# Patient Record
Sex: Female | Born: 1960 | Race: White | Hispanic: No | Marital: Married
Health system: Southern US, Community
[De-identification: ages and names within clinical notes are randomized; demographics above are authoritative.]

---

## 2005-06-26 ENCOUNTER — Ambulatory Visit: Payer: Self-pay | Admitting: Unknown Physician Specialty

## 2006-07-02 ENCOUNTER — Ambulatory Visit: Payer: Self-pay | Admitting: Unknown Physician Specialty

## 2010-07-11 ENCOUNTER — Ambulatory Visit: Payer: Self-pay | Admitting: Obstetrics and Gynecology

## 2012-03-07 ENCOUNTER — Ambulatory Visit: Payer: Self-pay | Admitting: Obstetrics and Gynecology

## 2013-08-23 ENCOUNTER — Observation Stay: Payer: Self-pay | Admitting: Internal Medicine

## 2013-08-23 LAB — COMPREHENSIVE METABOLIC PANEL
Albumin: 3.3 g/dL — ABNORMAL LOW (ref 3.4–5.0)
Anion Gap: 11 (ref 7–16)
Bilirubin,Total: 0.4 mg/dL (ref 0.2–1.0)
Calcium, Total: 8.4 mg/dL — ABNORMAL LOW (ref 8.5–10.1)
Chloride: 107 mmol/L (ref 98–107)
Co2: 21 mmol/L (ref 21–32)
EGFR (African American): >60
EGFR (Non-African Amer.): >60
Glucose: 156 mg/dL — ABNORMAL HIGH (ref 65–99)
Osmolality: 283 (ref 275–301)
Potassium: 3 mmol/L — ABNORMAL LOW (ref 3.5–5.1)
SGOT(AST): 31 U/L (ref 15–37)
Sodium: 139 mmol/L (ref 136–145)

## 2013-08-23 LAB — CBC
HGB: 13.3 g/dL (ref 12.0–16.0)
MCH: 33.5 pg (ref 26.0–34.0)
MCHC: 34.6 g/dL (ref 32.0–36.0)
MCV: 97 fL (ref 80–100)
Platelet: 230 10*3/uL (ref 150–440)
RBC: 3.97 10*6/uL (ref 3.80–5.20)
RDW: 11.7 % (ref 11.5–14.5)
WBC: 7.8 10*3/uL (ref 3.6–11.0)

## 2013-08-23 LAB — URINALYSIS, COMPLETE
Bilirubin,UR: NEGATIVE
Blood: NEGATIVE
Glucose,UR: NEGATIVE mg/dL (ref 0–75)
Hyaline Cast: 3
Leukocyte Esterase: NEGATIVE
Nitrite: NEGATIVE
Protein: 100
RBC,UR: NONE SEEN /HPF (ref 0–5)
Specific Gravity: 1.019 (ref 1.003–1.030)

## 2013-08-23 LAB — TSH: Thyroid Stimulating Horm: 4.38 u[IU]/mL

## 2013-08-24 LAB — BASIC METABOLIC PANEL
Anion Gap: 6 — ABNORMAL LOW (ref 7–16)
BUN: 13 mg/dL (ref 7–18)
Chloride: 108 mmol/L — ABNORMAL HIGH (ref 98–107)
Co2: 25 mmol/L (ref 21–32)
Creatinine: 0.71 mg/dL (ref 0.60–1.30)
EGFR (African American): >60
EGFR (Non-African Amer.): >60
Sodium: 139 mmol/L (ref 136–145)

## 2013-08-24 LAB — LIPID PANEL
Cholesterol: 149 mg/dL (ref 0–200)
VLDL Cholesterol, Calc: 34 mg/dL (ref 5–40)

## 2013-08-24 LAB — HEMOGLOBIN A1C: Hemoglobin A1C: 5 % (ref 4.2–6.3)

## 2013-08-24 LAB — CBC WITH DIFFERENTIAL/PLATELET
Basophil #: 0 10*3/uL (ref 0.0–0.1)
Basophil %: 0.4 %
Eosinophil #: 0.1 10*3/uL (ref 0.0–0.7)
HCT: 36.2 % (ref 35.0–47.0)
Lymphocyte %: 19 %
MCH: 33.6 pg (ref 26.0–34.0)
MCHC: 35.3 g/dL (ref 32.0–36.0)
MCV: 95 fL (ref 80–100)
Neutrophil #: 8.6 10*3/uL — ABNORMAL HIGH (ref 1.4–6.5)
Neutrophil %: 73.4 %
Platelet: 217 10*3/uL (ref 150–440)
RDW: 11.7 % (ref 11.5–14.5)
WBC: 11.7 10*3/uL — ABNORMAL HIGH (ref 3.6–11.0)

## 2013-08-24 LAB — MAGNESIUM: Magnesium: 1.4 mg/dL — ABNORMAL LOW

## 2013-08-24 LAB — TROPONIN I: Troponin-I: <0.02 ng/mL

## 2015-03-22 NOTE — Consult Note (Signed)
PATIENT NAME:  Heidi [name], Heidi [name] MR#:  [id number] DATE OF BIRTH:  Feb 25, 1961  DATE OF CONSULTATION:  08/25/2013  REFERRING PHYSICIAN:  Katharina Caperima Vaickute, MD CONSULTING PHYSICIAN:  Lamar BlinksBruce J. Shloma Roggenkamp, MD  REASON FOR CONSULTATION: Syncope with cardiomyopathy and left bundle branch block.   CHIEF COMPLAINT: "I passed out."   HISTORY OF PRESENT ILLNESS: This is a 54 year old female with no history of cardiovascular disease who had a syncopal episode. The patient has had some dizziness, weakness and shortness of breath and passed out and hurt herself. At that time, the patient woke up and had improvements with no evidence of further issues. During her hospitalization, she did have and telemetry with no evidence of rhythm disturbances and/or no evidence of heart attack with a normal troponin and EKG. EKG shows normal sinus rhythm with left bundle branch block unchanged throughout her hospital course. Echocardiogram has shown severe LV systolic dysfunction with moderate left ventricular enlargement with mild mitral and tricuspid regurgitation. The patient has had no further evidence of syncope, chest pain, shortness of breath or other symptoms in the last many weeks.   REVIEW OF SYSTEMS: The remainder is negative for vision change, ringing in the ears, hearing loss, cough, congestion, heartburn, nausea, vomiting, diarrhea, bloody stools, stomach pain, extremity pain, leg weakness, cramping of the buttocks, known blood clots, headaches, blackouts, dizzy spells, nosebleeds, congestion, trouble swallowing, frequent urination, urination at night, muscle weakness, numbness and anxiety in the past.   PAST MEDICAL HISTORY: Hypertension.   FAMILY HISTORY: No family members with early onset of cardiovascular disease or hypertension.   SOCIAL HISTORY: Currently denies tobacco use. Occasionally drinks alcohol.   ALLERGIES: As listed.   MEDICATIONS: As listed.   PHYSICAL EXAMINATION: VITAL SIGNS: Blood pressure is  133/68 bilaterally and heart rate 72 upright and reclining and regular.  GENERAL: She is a well appearing female in no acute distress.  HEENT: No icterus, thyromegaly, ulcers, hemorrhage or xanthelasma.  HEART: Regular rate and rhythm. Normal S1 and S2 without murmur, gallop or rub. PMI is normal size and placement. Carotid upstroke normal without bruit. Jugular venous pressure is normal.  LUNGS: Have normal respirations, clear to auscultation.  ABDOMEN: Soft and nontender without hepatosplenomegaly or masses. Abdominal aorta is normal size without bruit.  EXTREMITIES: Shows 2+ bilateral pulses in dorsal, pedal, radial and femoral arteries without lower extremity edema, cyanosis, clubbing or ulcers.  NEUROLOGIC: She is oriented to time, place and person with normal mood and affect.   ASSESSMENT: A 54 year old female with hypertension and new onset syncope of unknown etiology, left bundle branch block, cardiomyopathy with no current evidence of congestive heart failure.   RECOMMENDATIONS: 1.  Carvedilol 3.125 mg twice a day for cardiomyopathy and reduction of possible rhythm disturbances causing syncope. 2.  The patient is okay for discharge to home, was ambulating well without any further significant symptoms. 3.  Holter monitor for 48 hours after discharge for further evaluation of rhythm disturbances that may have caused significant issues listed above.  4.  Further outpatient diagnostic testing including stress test and/or cardiac catheterization as necessary depending on above. ____________________________ Lamar BlinksBruce J. Jaclyn Andy, MD bjk:sb D: 08/25/2013 12:55:33 ET [name]: 08/25/2013 13:25:10 ET JOB#: [id number]  cc: Lamar BlinksBruce J. Svara Twyman, MD, <Dictator> Lamar BlinksBRUCE J Dario Yono MD ELECTRONICALLY SIGNED 08/29/2013 10:34

## 2015-03-22 NOTE — Consult Note (Signed)
PATIENT NAME:  Heidi [name], Heidi [name] MR#:  [id number] DATE OF BIRTH:  [birth year]  DATE OF CONSULTATION:  08/24/2013  REFERRING PHYSICIAN:  Felipa Furnaceoberto Sanchez Gutierrez, MD CONSULTING PHYSICIAN:  Daja Shuping K. Sherryll BurgerShah, MD PRIMARY CARE PHYSICIAN, GYNECOLOGIST: Beryle QuantLynde G. Knowles-Jonas, MD  REASON FOR CONSULTATION: Syncopal episode versus seizure.   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   The patient has been going through quite stressful situation because she does not have enough information and multiple physicians are coming to ask her questions.   The patient mentioned that she has been having tremors in both her hands for last 2 weeks or so, which is progressively getting worse. She was also noted to have some high blood pressure, was seen by her GYN doctor and was started on hydrochlorothiazide.   She stopped taking it because she was shaking a lot.   The patient was shaking so much that she was not able to cook her dinner, et Karie Sodacetera.   She was standing at the checking counter in the [employer] and felt something coming on and fell. She was quite pale. The clerk noted that the patient was staring and then shaking all over. She had bitten her tongue. The patient had not lost control of her bowel and bladder.   The patient was brought to the ER. At that time, she was back to herself. The patient was also found to have a left bundle branch block.   PAST MEDICAL HISTORY: Significant for recently diagnosed hypertension.   PAST SURGICAL HISTORY: None.   FAMILY HISTORY: Significant for stroke in her father, as well as lung cancer in the grandfather.   SOCIAL HISTORY: Significant in that she does not smoke. She drinks wine occasionally. She does not do any drugs. 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 She lives with her husband.    CURRENT MEDICATIONS: I reviewed her home medication list.   REVIEW OF SYSTEMS:  Positive for tremors and recent syncope. Otherwise, 10-system review of system was asked and was found to be negative.   LABORATORY DATA:  I reviewed her current labs.   PHYSICAL EXAMINATION: VITAL SIGNS: Temperature 97.5, pulse 80, respiratory rate 18, blood pressure 154/89, pulse oximetry 98.  GENERAL: She is a middle-aged Caucasian female standing next to the washbasin with her husband and a family friend. The patient looked quite anxious and nervous and was shaking when she was trying to show me her medications.  LUNGS: Clear to auscultation.  HEART: S1, S2 heart sounds. Carotid exam did not reveal any bruit.  EYES: Her funduscopic exam was unremarkable.  MENTAL STATUS EXAMINATION: She was alert, oriented, followed 2-step commands. Her attention, concentration, memory, fund of knowledge seem to be appropriate. Her language is intact. She does not have neurological neglect. I did not see any other paranoia, delusions, hallucinations. She does seem to be quite anxious.  CRANIAL NERVES: Her pupils are equal, round and reactive. Extraocular movements were intact. Her visual fields were full. Her face was symmetric. Tongue was midline. Facial sensations and hearing were intact. Her palate was going symmetrically bilaterally. Her tongue protruded straight. Her shoulder shrug and neck strength seems to be 5 out of 5.  MOTOR EXAM: She has normal tone and strength of 5 out of 5, but she has generalized tremor which changes in frequency and  severity.  SENSATIONS: Intact to light touch.  DEEP TENDON REFLEXES: Were 2+, except her bilateral ankle jerks were 1+. Her toes were downgoing.  GAIT: Normal.  COORDINATION: Okay.  The patient also had tongue laceration on both sides, left more than right.   RADIOLOGICAL DATA: I reviewed her MRI of the brain which was unremarkable, as well as the EEG and carotid ultrasound which  were unremarkable, except minimal plaque without any hemodynamic significance.   ASSESSMENT AND PLAN: 1.  The patient seemed to have a syncopal seizure, which is a seizure being the second event secondary to syncope.   The patient has been going through significant stress for the last couple of weeks with [facility] being transitioned to [employer], a new electronic medical record system, with the patient being a lead for [employer].   The patient was having significant tremors. The patient has a known history of vasovagal syncope when she gets blood drawn.   I believe the episode that happened at [facility] where she was standing for some time might have represented vasovagal syncope, where some patients can have syncopal myoclonus, but the patient seemed to have generalized seizure from it.   I think this should be considered as a provoked seizure, I will not start her on antiepileptic medications. I agree with seizure work-up of MRI and EEG, which have been unremarkable.   The patient still should follow the seizure precautions such as no driving for 6 months, not climbing on high ladder or swimming by herself, Melvern Banker.   I taught seizure first aid to the patient and family such as not putting anything in the mouth, calling 911, turning the patient on the side, et Karie Soda.   2.  Stress and anxiety might be the cause of her tremors, which does not seem to be similar to benign essential tremor.   The patient can be seen as an outpatient by psychiatrist for her anxiety management, which is quite situational.   I will see her back as an outpatient in a week or 2.  ____________________________ Jabree Pernice K. Sherryll Burger, MD hks:jm D: 08/24/2013 19:41:57 ET [name]: 08/24/2013 21:21:57 ET JOB#: [id number]  cc: Charli Liberatore K. Sherryll Burger, MD, <Dictator> Beryle Quant. Toya Smothers, MD Durene Cal [facility] MD ELECTRONICALLY SIGNED 08/25/2013 8:40

## 2015-03-22 NOTE — H&P (Signed)
PATIENT NAME:  Heidi [name], Heidi [name] MR#:  [id number] DATE OF BIRTH:  1961-10-18  DATE OF ADMISSION:  08/23/2013  PRIMARY CARE PHYSICIAN: Dr. Skeet Simmer, GYN.  CHIEF COMPLAINT: Syncopal episode.   HISTORY OF PRESENT ILLNESS: This is a very nice 54 year old female with a history of recently diagnosed hypertension. She has been healthy all her life. Her only PCP has been actually her GYN who she sees once a year for her Pap smears. The patient starts with 3-1/2 weeks ago with elevation of high blood pressure, for what she was started on hydrochlorothiazide. She took hydrochlorothiazide for 2 weeks and then she had to stop it because she was shaking a lot. The shakiness get worse and worse and worse to the point that within the last couple of nights, she was not able even to cook dinner because her right hand was not able to be operated. The patient actually had a normal day today. She got up, went to work being her normal self. After she finished her work, she went to the store. She walked around and then her eyes started crossing. She felt faint and lightheaded, felt a little bit nauseous, and then she blacked out. Apparently, she was still standing at some point while blacked out, sustained by the cart, and then she fell to the floor. She does not remember anything else, but apparently the clerk in the store said that she was lying down on the floor, that there might have been some shaking, she bit her tongue up. There was no witnessed seizure activity other than what was described by the clerk. The patient was brought into the Emergency Department. There was a normal physical exam, normal CT of the head. The patient is admitted for evaluation due to left bundle branch block that is unknown if it is new or old. The patient is in good condition, and the only problem that she is having is that she is talking a lot and she is not able to find the words. The patient is admitted for observation.   REVIEW OF SYSTEMS:     CONSTITUTIONAL: No fever, fatigue, weakness, weight loss or weight gain.  EYES: No double vision, blurry vision.  EARS, NOSE, THROAT: No difficulty swallowing. No tinnitus.  RESPIRATORY: No cough, wheezing or painful respirations.  CARDIOVASCULAR: No chest pain, orthopnea, paroxysmal nocturnal dyspnea. The patient had an episode of syncope many years ago after giving blood, but never repeated again up until now.  GASTROINTESTINAL: No nausea, vomiting, abdominal pain, constipation, diarrhea.  GENITOURINARY: No dysuria, hematuria, changes in frequency.  ENDOCRINE: No polyuria, polydipsia, polyphagia, cold or heat intolerance.  HEMATOLOGIC AND LYMPHATIC: No anemia, easy bruising or bleeding.  SKIN: No rashes or petechiae or new lesions.  MUSCULOSKELETAL: No significant neck pain, back pain.  NEUROLOGIC: No numbness or tingling. No vertigo. No CVAs or TIAs.  PSYCHIATRIC: No insomnia, depression or significant agitation.   PAST MEDICAL HISTORY: Recently diagnosed hypertension. Otherwise, the patient has been healthy all her life.   ALLERGIES: No known drug allergies.   PAST SURGICAL HISTORY: None.   FAMILY HISTORY: CVA in her father. No MI. Positive lung cancer in her grandfather.   MEDICATIONS: Hydrochlorothiazide, recently started. The patient takes multivitamins and birth control.   SOCIAL HISTORY: Does not smoke. She drinks 2 glasses of wine every night. She does not use any drugs. 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 She is very active.   PHYSICAL EXAMINATION: VITAL SIGNS: Blood  pressure is 163/77, pulse 93, respirations 18, temperature 98.2, pulse oximetry of 100% on room air.  GENERAL: The patient is alert, oriented x 3, in no acute distress, no respiratory distress. Hemodynamically stable. Her speech is rapid and occasionally she is not able to find words. There is no facial droop or any other significant neurologic changes. Her speech is goal  directed.  HEENT: Pupils are equal and reactive. Extraocular movements are intact. Mucosae are moist. Anicteric sclerae. Pink conjunctivae. No oral lesions. No oropharyngeal exudates. Her tongue is central. Her uvula is central. There is no facial droop. Positive oral lesions on the tongue, some erythema from patient biting her tongue, no active bleeding.  NECK: Supple. No JVD. No thyromegaly. No adenopathy. No carotid bruits. No rigidity.  CARDIOVASCULAR: Regular rate and rhythm. No murmurs, rubs or gallops are appreciated. Slightly tachycardic in the 90s. No displacement of PMI.  LUNGS: Clear without any wheezing or crepitus. No use of accessory muscles. No significant dullness to percussion.  ABDOMEN: Soft, nontender, nondistended. No hepatosplenomegaly. No masses. Bowel sounds are positive. No rebound.  EXTREMITIES: No edema, cyanosis or clubbing.  VASCULAR: Pulses +2. Capillary refill less than 3.  MUSCULOSKELETAL: No joint effusions or joint swelling. Normal range of motion all joints.  SKIN: No rashes, petechiae. Normal turgor.  NEUROLOGIC: Cranial nerves II through XII intact. Strength is 5/5 in all 4 extremities. Deep tendon reflexes +2.  PSYCHIATRIC: No significant insomnia. Mild anxiety and rapid speech. No significant slurred speech.  LYMPHATICS: Negative for lymphadenopathy in neck or supraclavicular areas.   LABORATORY AND DIAGNOSTIC DATA: Glucose 156, BUN 19, creatinine 0.92; potassium is 3.0; calcium is 8.4, total protein 6.3, albumin 3.3. LFTs otherwise within normal limits. Troponin is negative x 1. White count 7.8, hemoglobin 13.3 and platelet count 230.   EKG: Left bundle branch but no ST depression or elevation. It is unknown if this left bundle branch has been there before or not.  ASSESSMENT AND PLAN: A 54 year old female with a history of recently diagnosed hypertension, comes with syncopal episode.  1.  Syncope: The patient had an episode of syncope that was not witnessed.  The patient was found on the floor drooling. There is no good history about tonic-clonic movements, but apparently the clerk in the store found her shaking. The patient has been complaining of tremors for the past 2 weeks that are new, and she relates that to the new medications that she is taking.  Work-up will include an echocardiogram and an ultrasound of the carotid arteries. The echocardiogram will focus on finding of left bundle branch block. We are going to put her on telemetry. The patient is going to be monitored on her vital signs. In general, blood pressure is elevated. We are going to correct blood pressures only if they are above 160. We are going to use clonidine for that.  2.  As far as her shaking and changes in mental status with speech problems, she says that she was slurred at some point. At this moment she is not slurred, but she is talking really fast and her speech sometimes cuts off and she is not able to find words. This could be secondary to stress and anxiety, but also since the patient has tremors and had a passing out episode, we are going to get an MRI to rule out any metastatic disease, any new-onset cerebrovascular accidents. The patient also is going to have a TSH to evaluate for hyperthyroidism. It looks like it is negative.  Thyroid is not palpable but again, her speech is rapid and she is slightly tachycardic.  3.  As far as her hypertension, the patient is going to be taking clonidine p.r.n. No long-acting medications are going to be put on the patient, as we do not want to lower her blood pressure too fast or too low. Consider adding on another agent, maybe Norvasc, for discharge. The patient will follow up with her primary care physician.  4.  We are going to replace her potassium, as it is low. We are going to check a hemoglobin A1c, as her blood sugars are in the 150s.  6.  As for deep vein thrombosis prophylaxis, we are going to give her heparin 3 times a day. 7.  As  gastrointestinal prophylaxis, we are going to give her Protonix.  8.  For prophylaxis of cerebrovascular accident, even if it is very low probability, we are going to do aspirin.   TIME SPENT: I spent about 55 minutes with this patient.    ____________________________ Felipa Furnace, MD rsg:jm D: 08/23/2013 21:25:21 ET T: 08/23/2013 21:52:16 ET JOB#: [id number]  cc: Felipa Furnace, MD, <Dictator> Heidi [name] Juanda Chance MD ELECTRONICALLY SIGNED 08/24/2013 11:11

## 2015-03-22 NOTE — Discharge Summary (Signed)
PATIENT NAME:  Heidi [name], [name] MR#:  [id number] DATE OF BIRTH:  [birth year]  DATE OF ADMISSION:  08/23/2013 DATE OF DISCHARGE:  08/25/2013  ADMITTING DIAGNOSIS: Syncope.   DISCHARGE DIAGNOSES: 1.  Complicated syncope.  2.  Cardiomyopathy with ejection fraction of less than 20%, moderate to severe mitral regurgitation, moderate tricuspid regurgitation.  3.  Hyperglycemia with Hemoglobin A1c 5. 4.  Hypokalemia, hypomagnesemia, hypocalcemia, relative vitamin D deficiency.  5.  Abnormal EKG with left bundle branch block. 6.  History of hypertension.   DISCHARGE CONDITION: Stable.   DISCHARGE MEDICATIONS: The patient is to continue: 1.  Vitamin C 1 tablet once daily. 2.  Calcium carbonate 1 tablet once daily. 3.  Glucosamine 1 tablet once daily. 4.  Junel 1/20 one tablet once daily. 5.  Cholecalciferol 1000 units once daily. 6.  Coreg 3.125 mg twice daily. 7.  Lisinopril 2.5 mg p.o. daily at bedtime. 8.  Aspirin 81 mg p.o. daily.  9.  Magnesium oxide 400 mg p.o. twice daily.   HOME OXYGEN: None.   DIET: 2 grams salt, low fat, low cholesterol, regular consistency.   ACTIVITY LIMITATIONS: As tolerated.  FOLLOWUP APPOINTMENT: With Dr. Gwen PoundsKowalski in 2 days after discharge and Dr. Sherryll BurgerShah, neurology, in 1 to 2 weeks after discharge.   CONSULTANTS: Dr. Sherryll BurgerShah of neurology and Dr. Gwen PoundsKowalski of cardiology.   RADIOLOGIC STUDIES: CT scan of head without contrast, on 24th of September 2014, revealed no evidence of acute intracranial abnormalities.   MRI of brain without contrast, on 25th of September 2014, revealed no focal abnormality, specifically no evidence of acute ischemia or focal abnormality.   Ultrasound of bilateral carotid arteries, on 25th of September 2014, revealed minimal internal carotid plaque formation on the right that is smooth (Dictation Anomaly) and without stenosis. Left carotid system showed some smooth plaque in the bulb which again caused no stenosis. Color Doppler and  spectral Doppler appearance was normal. Peak systolic velocities were normal bilaterally. Antegrade flow was seen in vertebral arteries without evidence of flow reversal. Internal to common carotid peak systolic velocity ratios were 2.131.16 on the right and 1.094 on the left.   Echocardiogram on the 25th of September 2014 showed left ventricular ejection fraction by visual estimation less than 20%, severely decreased global left ventricular systolic function, moderately increased left ventricular internal cavity size, mildly dilated left atrium as well as right atrium, moderate to severe mitral valve regurgitation, moderate tricuspid regurgitation, moderately increased left ventricular posterior wall thickness.   HISTORY OF PRESENT ILLNESS AND HOSPITAL COURSE: The patient is a 54 year old Caucasian female with past medical history significant for history of hypertension who presented to the hospital with complaints of syncopal episode. Please refer to Dr. Ardine EngSanchez's admission note on the 24th of September 2014.   On arrival to the Emergency Room, the patient's blood pressure was 163/77, pulse was 93, respiration rate was 18, temperature was 98.2 and pulse oximetry was 100% on room air. Physical examination was unremarkable.   The patient's lab data done in the Emergency Room was remarkable for potassium level of 3.0, BUN of 19 and glucose 156; otherwise, BMP was unremarkable. The patient's calcium level was low at 8.4. Magnesium 1.4. Liver enzymes revealed albumin level of 3.3, otherwise normal. Troponin was normal x 3. TSH was normal at 4.38. CBC was within normal limits. Urinalysis was unremarkable. EKG sinus rhythm at 93 beats per minute, fusion complexes and left bundle branch block was noted.  The patient was admitted to the hospital for  further evaluation. Because of her complicated syncope, a full workup was performed including carotid ultrasound and echocardiogram as well as MRI of the brain since the  patient had a feeling of left-sided numbness just prior to syncopizing. She also had an electroencephalogram done read by Dr. Malvin Johns. Electroencephalogram was performed and the 25th of September. The patient was noted to have routine EEG and in the awake as well as drowsy state and this was within normal limits. It was felt that the patient very likely had a syncopal episode of unclear etiology. However, arrhythmia could not have been excluded. Consultation with Dr. Arnoldo Hooker was obtained due to her echocardiogram showing ejection fraction of less than 20% and significant valve motion abnormalities. Dr. Gwen Pounds saw the patient in consultation on the 26th of September 2014. He felt the patient had unknown etiology for onset of syncope, left bundle branch block, as well as cardiomyopathy with no evidence of congestive heart failure. He recommended carvedilol as well as Holter monitor for 48 hours after discharge for further evaluation of rhythm disturbances and recommended to discharge the patient home. Further outpatient diagnostic testing including stress test and/or cardiac catheterization as necessary depending on prior studies, according to Dr. Philemon Kingdom recommendation. The patient is going to have her Holter monitor added for 48 hours. Her vital signs on the day of discharge are stable with temperature of 98.5, pulse 85, respiration rate 20, blood pressure 129/73 and saturation 97% on room air at rest. The patient is being discharged in stable condition with the above-mentioned medications and follow-up.   Of note, the patient's vitamin D level was checked and was found to be relatively low at 32.7. The patient's magnesium as well as potassium level were also found to be low and those were supplemented IV as well as p.o. Hemoglobin A1c was also checked and was found to be 5.0. Lipid panel was performed and LDL was 63, triglycerides were 168 and HDL was 152.   TIME SPENT: 40  minutes. ____________________________ Katharina Caper, MD rv:sb D: 08/25/2013 16:43:36 ET T: 08/25/2013 17:12:55 ET JOB#: [id number]  cc: Katharina Caper, MD, <Dictator> Lamar Blinks, MD Hemang K. Sherryll Burger, MD Katharina Caper MD ELECTRONICALLY SIGNED 09/12/2013 8:21

## 2016-02-06 ENCOUNTER — Other Ambulatory Visit: Payer: Self-pay | Admitting: Obstetrics and Gynecology

## 2016-02-06 DIAGNOSIS — Z1231 Encounter for screening mammogram for malignant neoplasm of breast: Secondary | ICD-10-CM

## 2019-05-19 ENCOUNTER — Other Ambulatory Visit: Payer: Self-pay | Admitting: Physician Assistant

## 2019-05-19 DIAGNOSIS — Z1231 Encounter for screening mammogram for malignant neoplasm of breast: Secondary | ICD-10-CM

## 2019-06-30 ENCOUNTER — Ambulatory Visit
Admission: RE | Admit: 2019-06-30 | Discharge: 2019-06-30 | Disposition: A | Discharge status: Home / Self Care | Payer: BC Managed Care – PPO | Source: Ambulatory Visit | Attending: Physician Assistant | Admitting: Physician Assistant

## 2019-06-30 DIAGNOSIS — Z1231 Encounter for screening mammogram for malignant neoplasm of breast: Secondary | ICD-10-CM | POA: Insufficient documentation

## 2019-08-24 ENCOUNTER — Other Ambulatory Visit: Admission: RE | Admit: 2019-08-24 | Payer: BC Managed Care – PPO | Source: Ambulatory Visit

## 2019-08-28 ENCOUNTER — Ambulatory Visit
Admission: RE | Admit: 2019-08-28 | Payer: BC Managed Care – PPO | Source: Home / Self Care | Admitting: Internal Medicine

## 2019-08-28 ENCOUNTER — Encounter: Admission: RE | Payer: Self-pay | Source: Home / Self Care

## 2019-08-28 SURGERY — COLONOSCOPY WITH PROPOFOL
Anesthesia: General

## 2020-04-06 IMAGING — MG DIGITAL SCREENING BILATERAL MAMMOGRAM WITH TOMO AND CAD
8 series · 8 of 24 positions shown · non-contrast
Comparison: Previous exam(s).

CLINICAL DATA: Screening.

EXAM:
DIGITAL SCREENING BILATERAL MAMMOGRAM WITH TOMO AND CAD

[L MLO synth-2D]
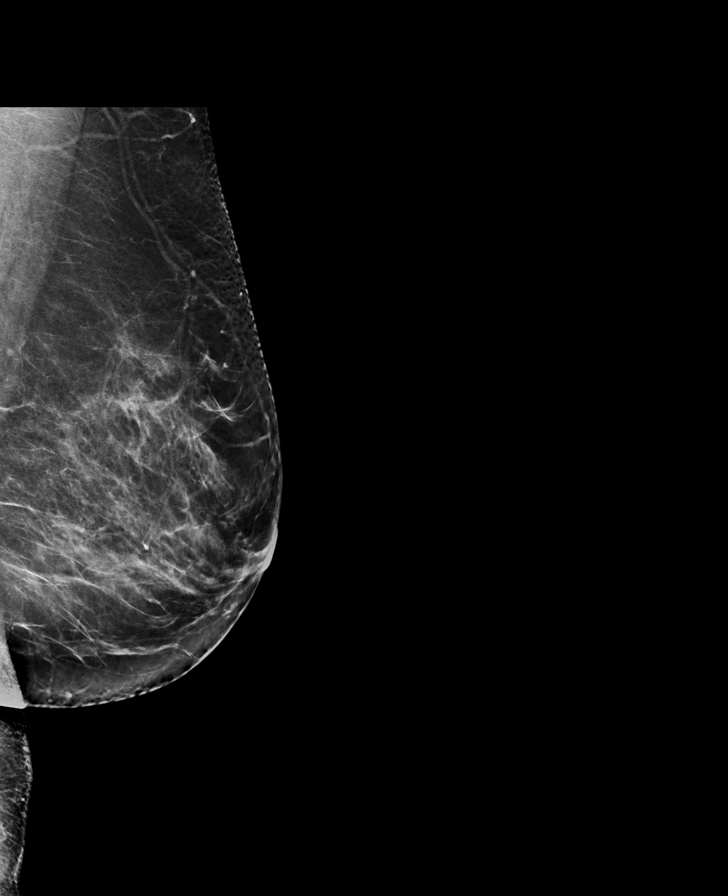

[R CC synth-2D]
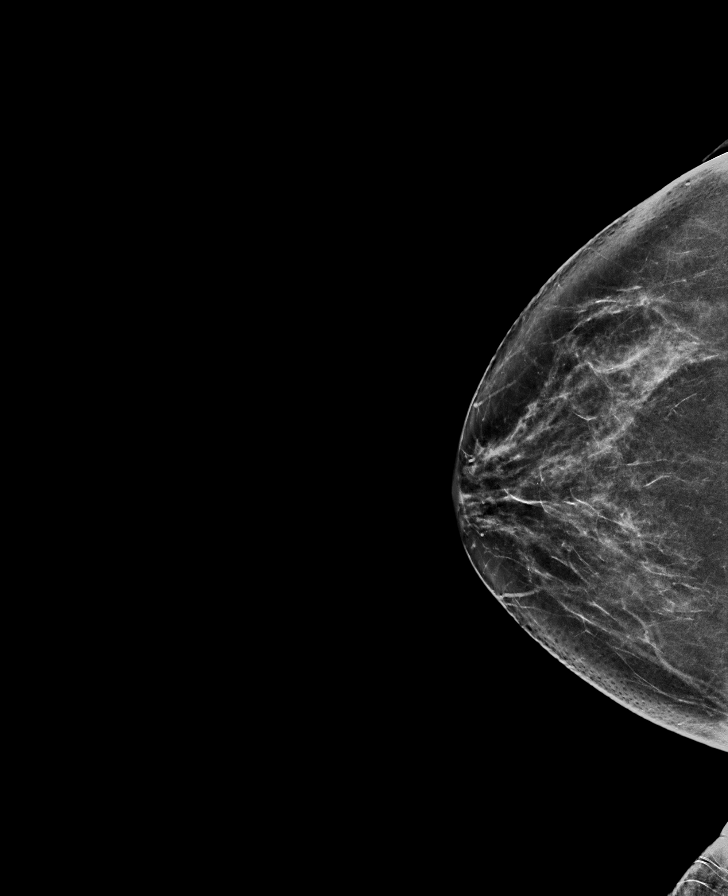

[L CC synth-2D]
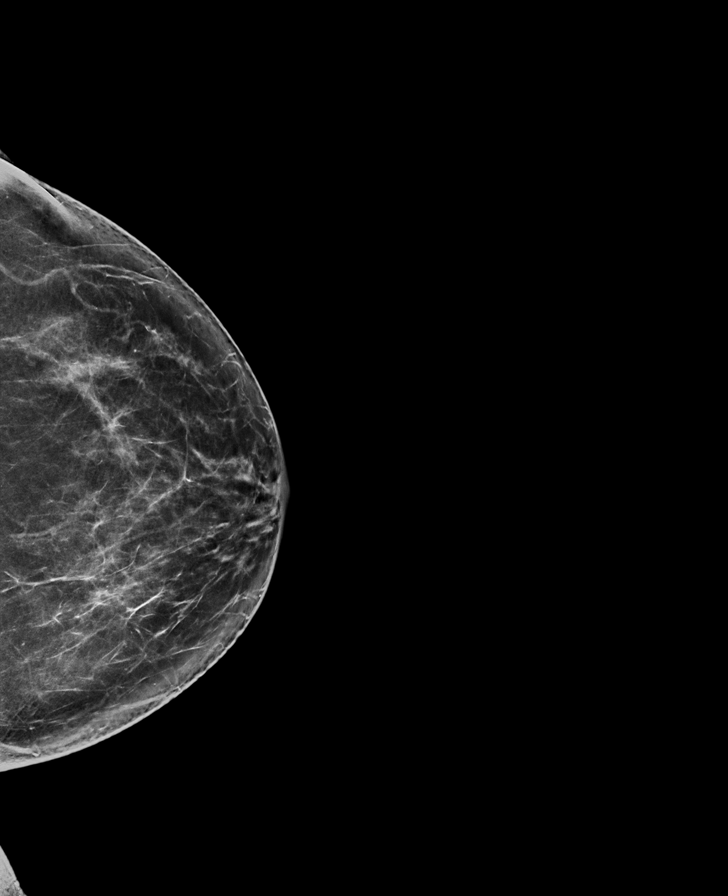

[R MLO synth-2D]
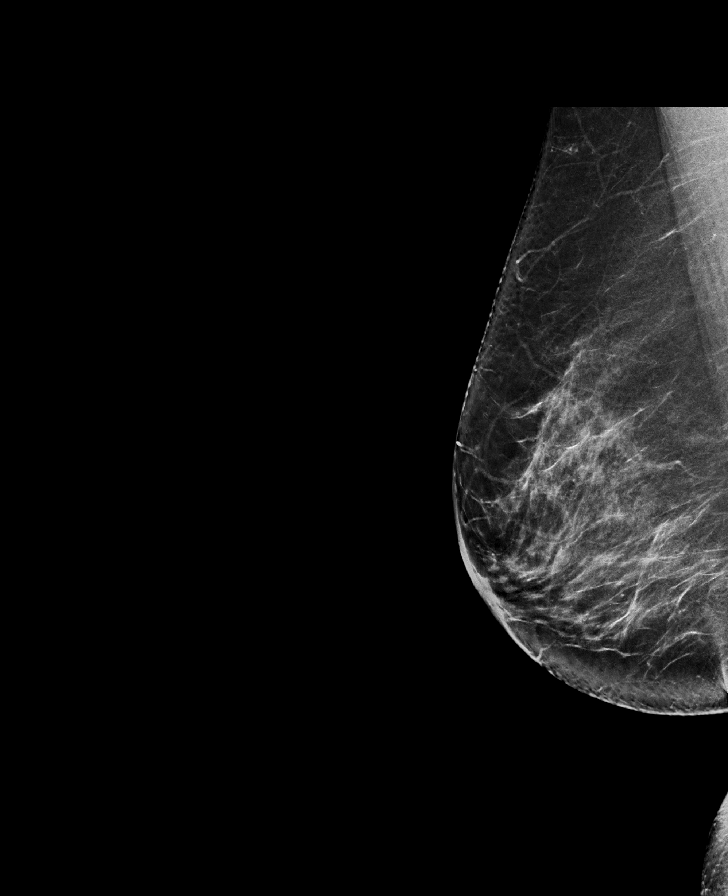

[L CC tomo · tomo slice 37/73.0]
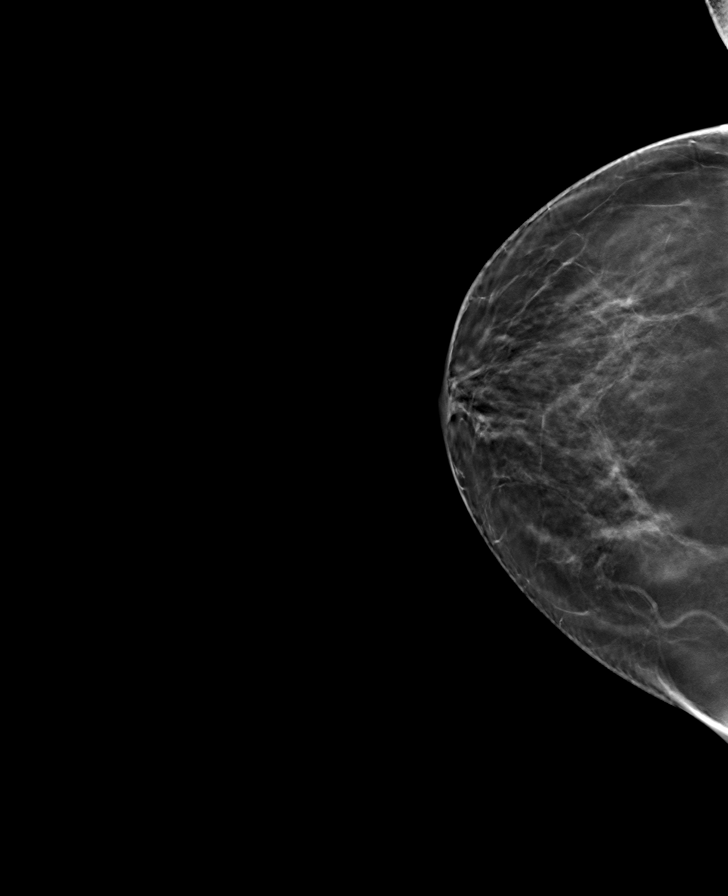

[L MLO tomo · tomo slice 40/79.0]
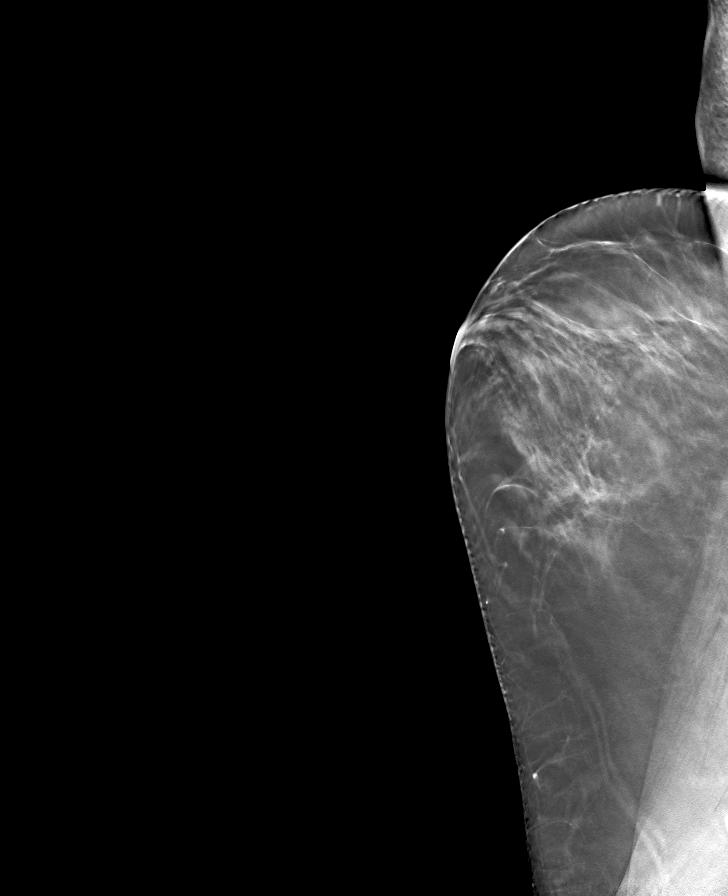

[R CC tomo · tomo slice 37/74.0]
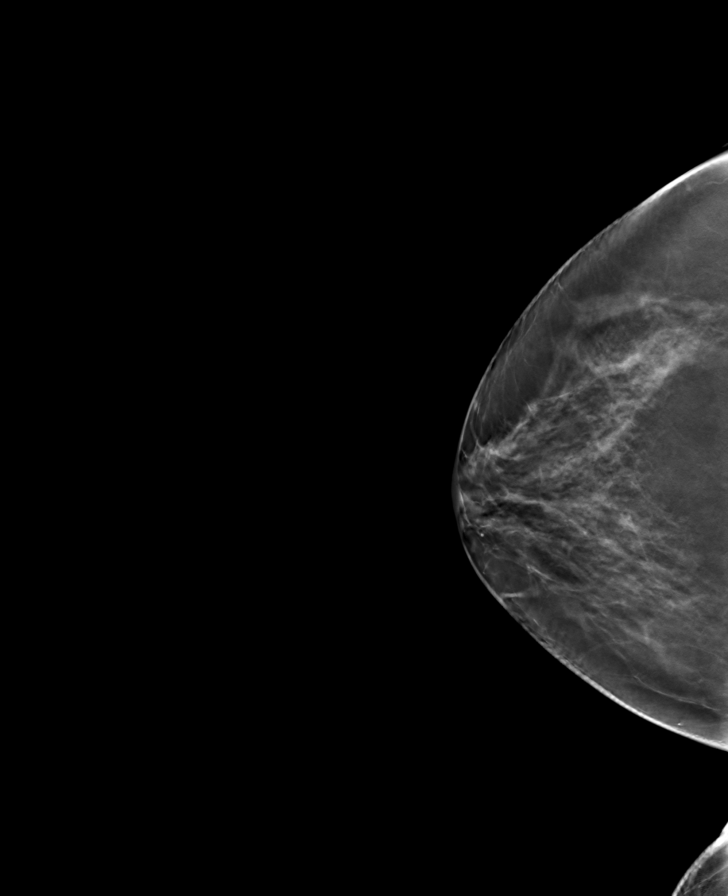

[R MLO tomo · tomo slice 40/79.0]
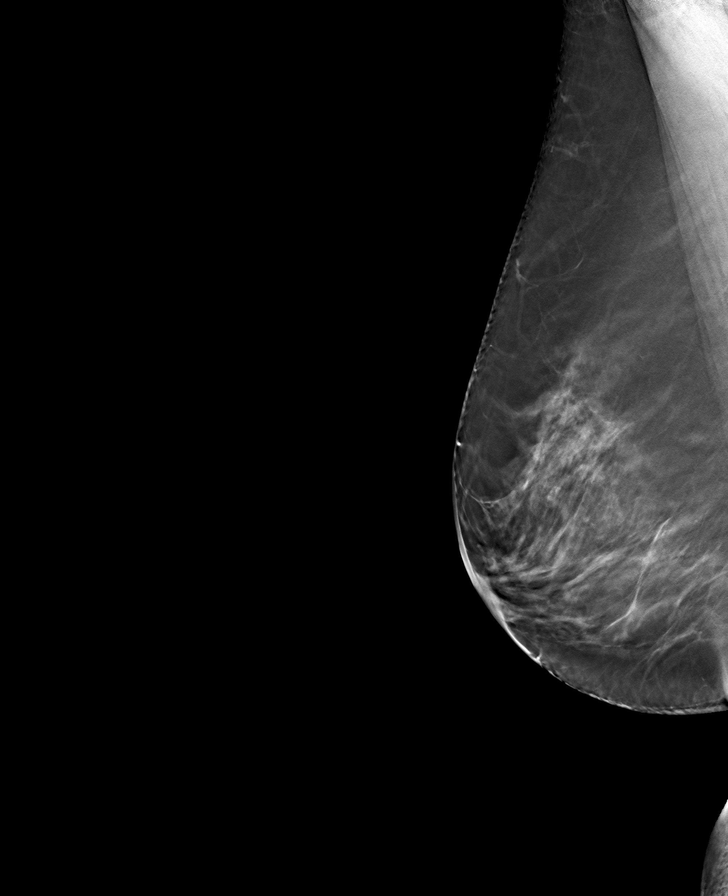

[8 of 24 positions shown; findings below may reference images not displayed]

ACR Breast Density Category b: There are scattered areas of
fibroglandular density.
FINDINGS: There are no findings suspicious for malignancy. Images were
processed with CAD.
IMPRESSION: No mammographic evidence of malignancy. A result letter of this
screening mammogram will be mailed directly to the patient.

RECOMMENDATION:
Screening mammogram in one year. (Code:[id number])

BI-RADS CATEGORY  1: Negative.

## 2021-11-04 ENCOUNTER — Other Ambulatory Visit: Payer: Self-pay | Admitting: Physician Assistant

## 2021-11-04 DIAGNOSIS — Z1231 Encounter for screening mammogram for malignant neoplasm of breast: Secondary | ICD-10-CM
